# Patient Record
Sex: Male | Born: 2000 | Race: White | Hispanic: No | Marital: Single | Smoking: Current every day smoker
Health system: Southern US, Community
[De-identification: ages and names within clinical notes are randomized; demographics above are authoritative.]

## PROBLEM LIST (undated history)

## (undated) DIAGNOSIS — T7840XA Allergy, unspecified, initial encounter: Secondary | ICD-10-CM

## (undated) HISTORY — PX: TEAR DUCT PROBING: SHX793

---
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
## 2020-03-16 ENCOUNTER — Ambulatory Visit: Payer: Self-pay | Attending: Internal Medicine

## 2021-11-10 ENCOUNTER — Ambulatory Visit
Admission: EM | Admit: 2021-11-10 | Discharge: 2021-11-10 | Disposition: A | Discharge status: Home / Self Care | Payer: BC Managed Care – PPO | Attending: Physician Assistant | Admitting: Physician Assistant

## 2021-11-10 ENCOUNTER — Other Ambulatory Visit: Payer: Self-pay

## 2021-11-10 ENCOUNTER — Encounter (HOSPITAL_BASED_OUTPATIENT_CLINIC_OR_DEPARTMENT_OTHER): Payer: Self-pay | Admitting: *Deleted

## 2021-11-10 ENCOUNTER — Encounter: Payer: Self-pay | Admitting: Emergency Medicine

## 2021-11-10 DIAGNOSIS — S61431A Puncture wound without foreign body of right hand, initial encounter: Secondary | ICD-10-CM | POA: Diagnosis not present

## 2021-11-10 DIAGNOSIS — W540XXA Bitten by dog, initial encounter: Secondary | ICD-10-CM

## 2021-11-10 DIAGNOSIS — Z23 Encounter for immunization: Secondary | ICD-10-CM

## 2021-11-10 MED ORDER — AMOXICILLIN-POT CLAVULANATE 875-125 MG PO TABS: 1.0000 | ORAL_TABLET | Freq: Two times a day (BID) | ORAL | 0 refills | Status: DC

## 2021-11-10 MED ORDER — TETANUS-DIPHTH-ACELL PERTUSSIS 5-2.5-18.5 LF-MCG/0.5 IM SUSY
0.5000 mL | PREFILLED_SYRINGE | Freq: Once | INTRAMUSCULAR | Status: AC
  Administered 2021-11-10: 0.5 mL via INTRAMUSCULAR

## 2021-11-10 NOTE — ED Provider Notes (Signed)
[facility]    CSN: [id number] Arrival date & time: 11/10/21  6378      History   Chief Complaint Chief Complaint  Patient presents with   Animal Bite    HPI Michael Dennis is a 20 y.o. male.   Patient here today for evaluation of a dog bite to his right hand that occurred 2 days ago. He reports the dog is his neighbors and is up to date with vaccinations. He has had mild pain when gripping but otherwise no continuous discomfort. He has had mild swelling, no significant erythema. He denies fever, nausea or vomiting. Patient cannot recall last tetanus vaccine and no information available in records to determine last Td.   The history is provided by the patient.   Past Medical History:  Diagnosis Date   Allergy    seasonal     There are no problems to display for this patient.   Past Surgical History:  Procedure Laterality Date   ORIF WRIST FRACTURE Left 07/25/2015   Procedure: LEFT CLOSED REDUCTION WRIST UNDER ANESTHESIA WITH PINNING AND SPLINTING;  Surgeon: Jones Broom, MD;  Location: [facility];  Service: Orthopedics;  Laterality: Left;  Left wrist closed reduction under anesthesia with pinning and splinting   TEAR DUCT PROBING         Home Medications    Prior to Admission medications   Medication Sig Start Date End Date Taking? Authorizing Provider  amoxicillin-clavulanate (AUGMENTIN) 875-125 MG tablet Take 1 tablet by mouth every 12 (twelve) hours. 11/10/21  Yes Tomi Bamberger, PA-C  ibuprofen (ADVIL,MOTRIN) 400 MG tablet Take 1 tablet (400 mg total) by mouth every 6 (six) hours as needed. 07/19/16   Jacalyn Lefevre, MD    Family History Family History  Problem Relation Age of Onset   Arthritis Maternal Grandmother    Cancer Maternal Grandmother    Heart disease Maternal Grandfather    Hypertension Maternal Grandfather    Asthma Paternal Grandfather     Social History Social History   Tobacco Use   Smoking status:  Every Day    Types: Cigarettes   Smokeless tobacco: Never  Substance Use Topics   Alcohol use: No   Drug use: No     Allergies   Patient has no known allergies.   Review of Systems Review of Systems  Constitutional:  Negative for chills and fever.  Eyes:  Negative for discharge and redness.  Respiratory:  Negative for shortness of breath.   Gastrointestinal:  Negative for nausea and vomiting.  Skin:  Positive for wound. Negative for color change.  Neurological:  Negative for numbness.    Physical Exam Triage Vital Signs ED Triage Vitals  Enc Vitals Group     BP      Pulse      Resp      Temp      Temp src      SpO2      Weight      Height      Head Circumference      Peak Flow      Pain Score      Pain Loc      Pain Edu?      Excl. in GC?    No data found.  Updated Vital Signs BP 133/73 (BP Location: Left Arm)   Pulse 79   Temp 98.5 F (36.9 C) (Oral)   Ht 6\' 4"  (1.93 m)   Wt 145 lb (65.8  kg)   SpO2 99%   BMI 17.65 kg/m   Physical Exam Vitals and nursing note reviewed.  Constitutional:      General: He is not in acute distress.    Appearance: Normal appearance. He is not ill-appearing.  HENT:     Head: Normocephalic and atraumatic.  Eyes:     Conjunctiva/sclera: Conjunctivae normal.  Cardiovascular:     Rate and Rhythm: Normal rate.  Pulmonary:     Effort: Pulmonary effort is normal.  Musculoskeletal:     Comments: Full ROM of right fingers, hand  Skin:    Comments: ~ 1 cm puncture wound to right palmar surface proximal to thenar imminence. No active bleeding. No surrounding erythema. Mild swelling  Neurological:     Mental Status: He is alert.  Psychiatric:        Mood and Affect: Mood normal.        Behavior: Behavior normal.        Thought Content: Thought content normal.     UC Treatments / Results  Labs (all labs ordered are listed, but only abnormal results are displayed) Labs Reviewed - No data to  display  EKG   Radiology No results found.  Procedures Procedures (including critical care time)  Medications Ordered in UC Medications  Tdap (BOOSTRIX) injection 0.5 mL (0.5 mLs Intramuscular Given 11/10/21 1051)    Initial Impression / Assessment and Plan / UC Course  I have reviewed the triage vital signs and the nursing notes.  Pertinent labs & imaging results that were available during my care of the patient were reviewed by me and considered in my medical decision making (see chart for details).   Tdap administered in office and discussed that currently wound does not appear infected but antibiotic prescribed in the event he develops more erythema, etc. Patient expresses understanding. Encouraged follow up with any further concerns.   Final Clinical Impressions(s) / UC Diagnoses   Final diagnoses:  Dog bite, initial encounter   Discharge Instructions   None    ED Prescriptions     Medication Sig Dispense Auth. Provider   amoxicillin-clavulanate (AUGMENTIN) 875-125 MG tablet Take 1 tablet by mouth every 12 (twelve) hours. 14 tablet Tomi Bamberger, PA-C      PDMP not reviewed this encounter.   Tomi Bamberger, PA-C 11/10/21 1101

## 2021-11-10 NOTE — ED Triage Notes (Signed)
Patient states that he was bitten by a dog on Saturday on his right hand.  The dog was current on vaccines.

## 2023-05-04 ENCOUNTER — Encounter: Payer: Self-pay | Admitting: Podiatry

## 2023-05-04 ENCOUNTER — Ambulatory Visit (INDEPENDENT_AMBULATORY_CARE_PROVIDER_SITE_OTHER): Payer: BC Managed Care – PPO | Admitting: Podiatry

## 2023-05-04 DIAGNOSIS — L600 Ingrowing nail: Secondary | ICD-10-CM | POA: Diagnosis not present

## 2023-05-04 MED ORDER — NEOMYCIN-POLYMYXIN-HC 1 % OT SOLN: OTIC | 1 refills | Status: AC

## 2023-05-04 NOTE — Patient Instructions (Signed)

## 2023-05-04 NOTE — Progress Notes (Signed)
  Subjective:  Patient ID: Michael Dennis, male    DOB: 11/02/2001,  MRN: [id number] HPI Chief Complaint  Patient presents with   Toe Pain    Hallux bilateral - lateral borders, ingrown (R>L), red, swollen, draining x few weeks, tried cutting out and cleaning   New Patient (Initial Visit)    22 y.o. male presents with the above complaint.   ROS: Denies fever chills nausea vomiting muscle aches pains calf pain back pain chest pain shortness of breath.  Past Medical History:  Diagnosis Date   Allergy    seasonal    Past Surgical History:  Procedure Laterality Date   ORIF WRIST FRACTURE Left 07/25/2015   Procedure: LEFT CLOSED REDUCTION WRIST UNDER ANESTHESIA WITH PINNING AND SPLINTING;  Surgeon: Jones Broom, MD;  Location: [facility];  Service: Orthopedics;  Laterality: Left;  Left wrist closed reduction under anesthesia with pinning and splinting   TEAR DUCT PROBING      Current Outpatient Medications:    NEOMYCIN-POLYMYXIN-HYDROCORTISONE (CORTISPORIN) 1 % SOLN OTIC solution, Apply 1-2 drops to toe BID after soaking, Disp: 10 mL, Rfl: 1  No Known Allergies Review of Systems Objective:  There were no vitals filed for this visit.  General: Well developed, nourished, in no acute distress, alert and oriented x3   Dermatological: Skin is warm, dry and supple bilateral. Nails x 10 are well maintained; remaining integument appears unremarkable at this time. There are no open sores, no preulcerative lesions, no rash or signs of infection present.  Sharp incurvated nail margins along the fibular border with gross granulation tissue purulence malodor hallux right over left  Vascular: Dorsalis Pedis artery and Posterior Tibial artery pedal pulses are 2/4 bilateral with immedate capillary fill time. Pedal hair growth present. No varicosities and no lower extremity edema present bilateral.   Neruologic: Grossly intact via light touch bilateral. Vibratory intact via  tuning fork bilateral. Protective threshold with Semmes Wienstein monofilament intact to all pedal sites bilateral. Patellar and Achilles deep tendon reflexes 2+ bilateral. No Babinski or clonus noted bilateral.   Musculoskeletal: No gross boney pedal deformities bilateral. No pain, crepitus, or limitation noted with foot and ankle range of motion bilateral. Muscular strength 5/5 in all groups tested bilateral.  Gait: Unassisted, Nonantalgic.    Radiographs:  None taken  Assessment & Plan:   Assessment: Ingrown toenail fibular border hallux bilateral right greater than left  Plan: Chemical matricectomy fibular border of the hallux bilaterally.  Tolerated procedure well.  Was given both oral and written home-going instructions for care and soaking of the toe and I will follow-up with him in 1 month     Sienna Stonehocker T. [place], [place]

## 2023-05-20 ENCOUNTER — Ambulatory Visit: Payer: BC Managed Care – PPO | Admitting: Podiatry

## 2023-05-20 DIAGNOSIS — L600 Ingrowing nail: Secondary | ICD-10-CM

## 2023-05-22 NOTE — Progress Notes (Signed)
Presents today for follow-up of his matrixectomy.  He states that he is doing so much better he is very happy that he had it done states that he continues to soak in Betadine.  Objective: Vital signs stable he is alert oriented x 3.  Pulses are palpable.  Mild erythema along the surgical sites of the hallux bilaterally.  No purulence no malodor I did recommend that he continue to soak.  Assessment well-healing matrixectomy hallux bilateral.  Plan: Continue to soak Epsom salts and warm water discontinue Betadine cover during the day but leave open at bedtime continue to soak until there is no redness no pain and no drainage.
